# Patient Record
Sex: Male | Born: 1980 | Hispanic: No | Marital: Married | Smoking: Never smoker
Health system: Southern US, Community
[De-identification: ages and names within clinical notes are randomized; demographics above are authoritative.]

---

## 2014-04-12 ENCOUNTER — Encounter (HOSPITAL_BASED_OUTPATIENT_CLINIC_OR_DEPARTMENT_OTHER): Payer: Self-pay | Admitting: Emergency Medicine

## 2014-04-12 ENCOUNTER — Emergency Department (HOSPITAL_BASED_OUTPATIENT_CLINIC_OR_DEPARTMENT_OTHER): Payer: Worker's Compensation

## 2014-04-12 ENCOUNTER — Emergency Department (HOSPITAL_BASED_OUTPATIENT_CLINIC_OR_DEPARTMENT_OTHER)
Admission: EM | Admit: 2014-04-12 | Discharge: 2014-04-12 | Disposition: A | Discharge status: Home / Self Care | Payer: Worker's Compensation | Attending: Emergency Medicine | Admitting: Emergency Medicine

## 2014-04-12 DIAGNOSIS — S61209A Unspecified open wound of unspecified finger without damage to nail, initial encounter: Secondary | ICD-10-CM | POA: Insufficient documentation

## 2014-04-12 DIAGNOSIS — Y990 Civilian activity done for income or pay: Secondary | ICD-10-CM | POA: Diagnosis not present

## 2014-04-12 DIAGNOSIS — Y9389 Activity, other specified: Secondary | ICD-10-CM | POA: Insufficient documentation

## 2014-04-12 DIAGNOSIS — Y9289 Other specified places as the place of occurrence of the external cause: Secondary | ICD-10-CM | POA: Insufficient documentation

## 2014-04-12 DIAGNOSIS — W298XXA Contact with other powered powered hand tools and household machinery, initial encounter: Secondary | ICD-10-CM | POA: Insufficient documentation

## 2014-04-12 DIAGNOSIS — S61219A Laceration without foreign body of unspecified finger without damage to nail, initial encounter: Secondary | ICD-10-CM

## 2014-04-12 MED ORDER — BUPIVACAINE HCL (PF) 0.5 % IJ SOLN
INTRAMUSCULAR | Status: AC
  Filled 2014-04-12: qty 10

## 2014-04-12 MED ORDER — BUPIVACAINE HCL (PF) 0.5 % IJ SOLN: 10.0000 mL | Freq: Once | INTRAMUSCULAR | Status: DC

## 2014-04-12 MED ORDER — HYDROCODONE-ACETAMINOPHEN 5-325 MG PO TABS: 1.0000 | ORAL_TABLET | ORAL | Status: AC | PRN

## 2014-04-12 MED ORDER — BUPIVACAINE HCL 0.5 % IJ SOLN
INTRAMUSCULAR | Status: AC
  Administered 2014-04-12: 10 mL
  Filled 2014-04-12: qty 1

## 2014-04-12 NOTE — ED Provider Notes (Addendum)
CSN: [id number]     Arrival date & time 04/12/14  [id number] History   First MD Initiated Contact with Patient 04/12/14 [phone]     Chief Complaint  Patient presents with  . Extremity Laceration    left second finger      HPI  Patient presents after an injury at work. He was holding a piece of metal with his left hand. He is attempting to drill through his right hand holding the drill. The it became lodged in the metal on the metal spun. He has a laceration to his left index finger. He has normal sensation.  History reviewed. No pertinent past medical history. History reviewed. No pertinent past surgical history. No family history on file. History  Substance Use Topics  . Smoking status: Never Smoker   . Smokeless tobacco: Never Used  . Alcohol Use: No    Review of Systems  Skin:       Wound to the left index finger. No additional areas of injury.      Allergies  Review of patient's allergies indicates no known allergies.  Home Medications   Prior to Admission medications   Medication Sig Start Date End Date Taking? Authorizing Provider  HYDROcodone-acetaminophen (NORCO/VICODIN) 5-325 MG per tablet Take 1 tablet by mouth every 4 (four) hours as needed. 04/12/14   Rolland PorterMark James, MD   BP 115/68  Pulse 98  Temp(Src) 98.5 F (36.9 C) (Oral)  Resp 16  SpO2 100% Physical Exam  Musculoskeletal:       Hands: FDP intact with flexion to the DIP joint. Normal sensation to the flap.    ED Course  LACERATION REPAIR Date/Time: 04/12/2014 11:19 AM Performed by: Rolland PorterJAMES, MARK Authorized by: Rolland PorterJAMES, MARK Consent: Verbal consent obtained. written consent obtained. Risks and benefits: risks, benefits and alternatives were discussed Consent given by: patient Patient understanding: patient states understanding of the procedure being performed Patient identity confirmed: verbally with patient Body area: upper extremity Location details: left index finger Laceration length: 7 cm Tendon  involvement: Laceration extends to, but not through the FDP tendons. Nerve involvement: none Vascular damage: no Anesthesia: digital block Local anesthetic: bupivacaine 0.5% without epinephrine Anesthetic total: 5 ml Patient sedated: no Preparation: Patient was prepped and draped in the usual sterile fashion. Irrigation solution: saline Irrigation method: syringe Amount of cleaning: standard Debridement: minimal (Defatting of devitalized adipose tissue) Degree of undermining: none Skin closure: 4-0 nylon Number of sutures: 10 Technique: simple Approximation: close Approximation difficulty: simple Dressing: Finger splint, Vaseline gauze, tube gauze. Patient tolerance: Patient tolerated the procedure well with no immediate complications.   (including critical care time) Labs Review Labs Reviewed - No data to display  Imaging Review Dg Finger Index Left  04/12/2014   CLINICAL DATA:  Laceration  EXAM: LEFT INDEX FINGER 2+V  COMPARISON:  None.  FINDINGS: Laceration on the plantar surface of the finger. Negative for fracture or foreign body.  IMPRESSION: Negative.   Electronically Signed   By: Marlan Palauharles  Clark M.D.   On: 04/12/2014 10:56     EKG Interpretation None      MDM   Final diagnoses:  Finger laceration, initial encounter    This laceration crosses his flexor surface of his DIP joint. I will have him followup with hand surgery. He may need to go through physical therapy to avoid contracture of his wound.  Exploration an exam here does not show nerve, or tendon injury. The wound extends to his FDP tendons are shown the proximal volar  P3. However, as it is taken through the range of motion, and visualized during repair, it is intact.  I discussed with him that the flap does have blood supply. After the tourniquet was removed after repair he does have return of capillary refill but it is delayed to the proximal aspect of this flap I've asked him to recheck here in 48 hours to  assess flap viability. Asked him to call his surgeon on-call. He was given Dr. Waynetta Peanonnolly's information. I would like him to be seen by hand surgery because this does cross a volar joint. There would be concern for long-term contracture.    Rolland PorterMark James, MD 04/12/14 1013  Rolland PorterMark James, MD 04/12/14 [phone]

## 2014-04-12 NOTE — Discharge Instructions (Signed)
Please return here in 48 hours for a wound check.  Call Dr. Izora Ribasoley, hand surgeon, for an appointment in 7-10 days.  The laceration crosses the joint which can cause some problems long-term with wound contraction. This is why I would like you to see the hand surgeon.  The "flap" of the wound can lose its blood supply and turn black. This is why I want to recheck in 48 hours  Cuidados de una laceracin - Adultos  (Laceration Care, Adult)  Neomia DearUna laceracin es un corte que atraviesa todas las capas de la piel. El corte llega hasta las capas inferiores de la piel.  CUIDADOS EN EL HOGAR  Si tiene puntos (suturas) o grapas:   Mantenga el corte limpio y seco.  Si tiene un (vendaje) cmbielo al menos una vez al da. Cmbielo si se moja o se ensucia, o segn las indicaciones del mdico.  Lave el corte dos veces por da con agua y [place]jabn. Enjugelo con agua. Seque dando palmaditas con un pao limpio y seco.  Aplique una capa delgada de crema con medicamento sobre el corte, segn las indicaciones del mdico.  Puede ducharse despus de las primeras 24 horas. No remoje la herida en agua hasta que le hayan quitado los puntos.  Tome slo los medicamentos que le haya indicado el mdico.  Concurra para que le retiren los puntos cuando el mdico le indique. En caso que tenga tiras [id number]:   Mantenga la herida limpia y seca.  No deje que las tiras se mojen. Puede tomar un bao, pero tenga cuidado de no mojar el corte.  Si se moja, squelo dando palmaditas con una toalla limpia.  Las tiras caern por s mismas. No quite las tiras que estn pegadas a la herida. En caso que le hayan Morrowaplicado adhesivo.   Puede ducharse o tomar un bao de inmersin. No frote ni sumerja la herida. Nopractique natacin. Evite transpirar mucho hasta que el [place] Gardensadhesivo desaparezca. Despus de ducharse o darse un bao, seque el corte dando palmaditas con una toalla limpia.  No aplique medicamentos ni maquillaje hasta que el  Monticelloadhesivo caiga.  Si tiene un vendaje, No peque cinta adhesiva sobre el QUALCOMMadhesivo  Evite la luz solar o las lmparas para bronceado hasta que el Readeradhesivo desaparezca. Aplique pantalla solar sobre el corte durante Dispensing opticianel primer ao, para reducir la Training and development officercicatriz.  El [place] Hillsadhesivo caer por s mismo. No quite el pegamento. Deber aplicarse la vacuna contra el ttanos si:  No recuerda cundo se coloc la vacuna la ltima vez.  Nunca recibi esta vacuna. Si usted necesita aplicarse la vacuna y se niega a recibirla, corre riesgo de contraer ttanos. sta es una enfermedad grave.  SOLICITE AYUDA DE INMEDIATO SI:   El dolor no mejora con los medicamentos prescriptos.  Pierde la sensibilidad (adormecimiento) en el brazo, la mano, la pierna o el pie u observa cambios en el color.  El corte sangra.  Siente debilidad en la articulacin o no puede usarla.  Tiene bultos que le duelen en el cuerpo.  La zona del corte est roja, le duele o esthinchada.  Hay una lnea roja en la piel, cerca del corte.  Observa un lquido blanco amarillento (pus) en la herida.  Tiene fiebre.  Advierte un olor ftido que proviene de la herida o del vendaje.  La herida se abre antes o despus de que le Mattelhayan quitado los puntos.  Nota que en la herida hay algn cuerpo extrao como un trozo de [place]madera o vidrio.  No puede mover los dedos. ASEGRESE DE QUE:   Comprende estas instrucciones.  Controlar su enfermedad.  Solicitar ayuda de inmediato si no mejora o si empeora. Document Released: 06/04/2011 Document Revised: 12/29/2011 [facility] Patient Information 2015 [employer], [place]. This information is not intended to replace advice given to you by your health care provider. Make sure you discuss any questions you have with your health care provider.

## 2014-04-12 NOTE — ED Notes (Signed)
Patient states he was working with a drill, drilling a hole through a piece of metal, when the strap of the drill got caught in the drill and went into his left second finger.

## 2015-03-05 IMAGING — CR DG FINGER INDEX 2+V*L*
3 series · 3 of 3 positions shown · non-contrast
Comparison: None.

CLINICAL DATA: Laceration

EXAM:
LEFT INDEX FINGER 2+V

[x finger pa left]
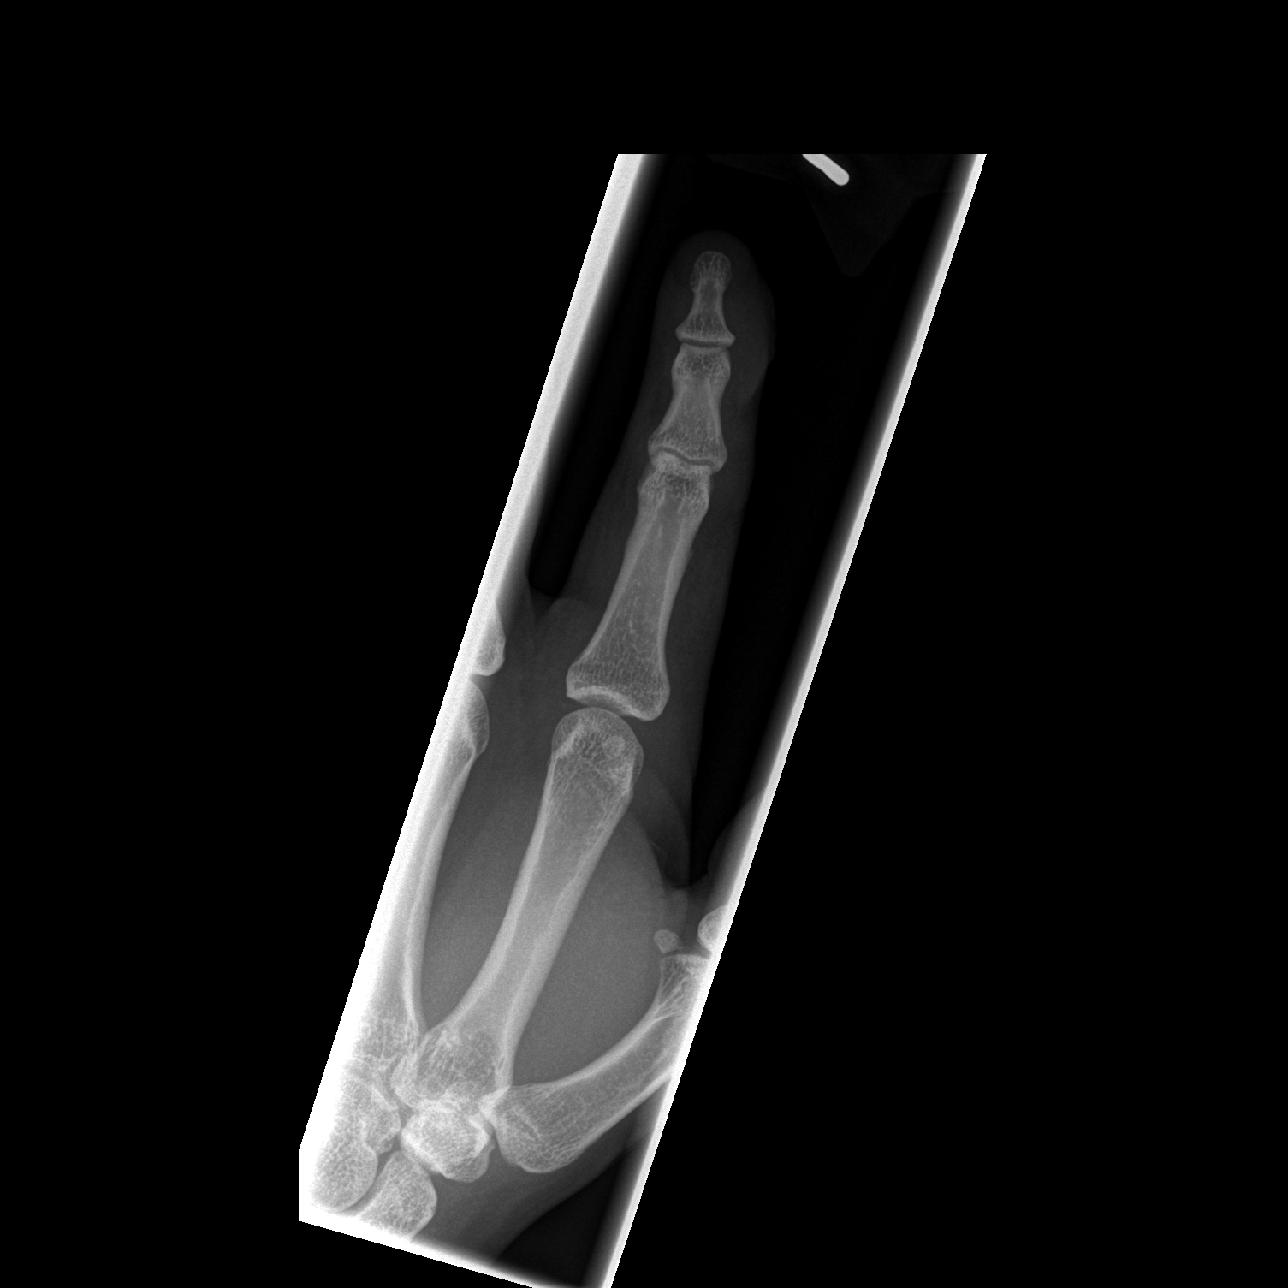

[x finger obl. left]
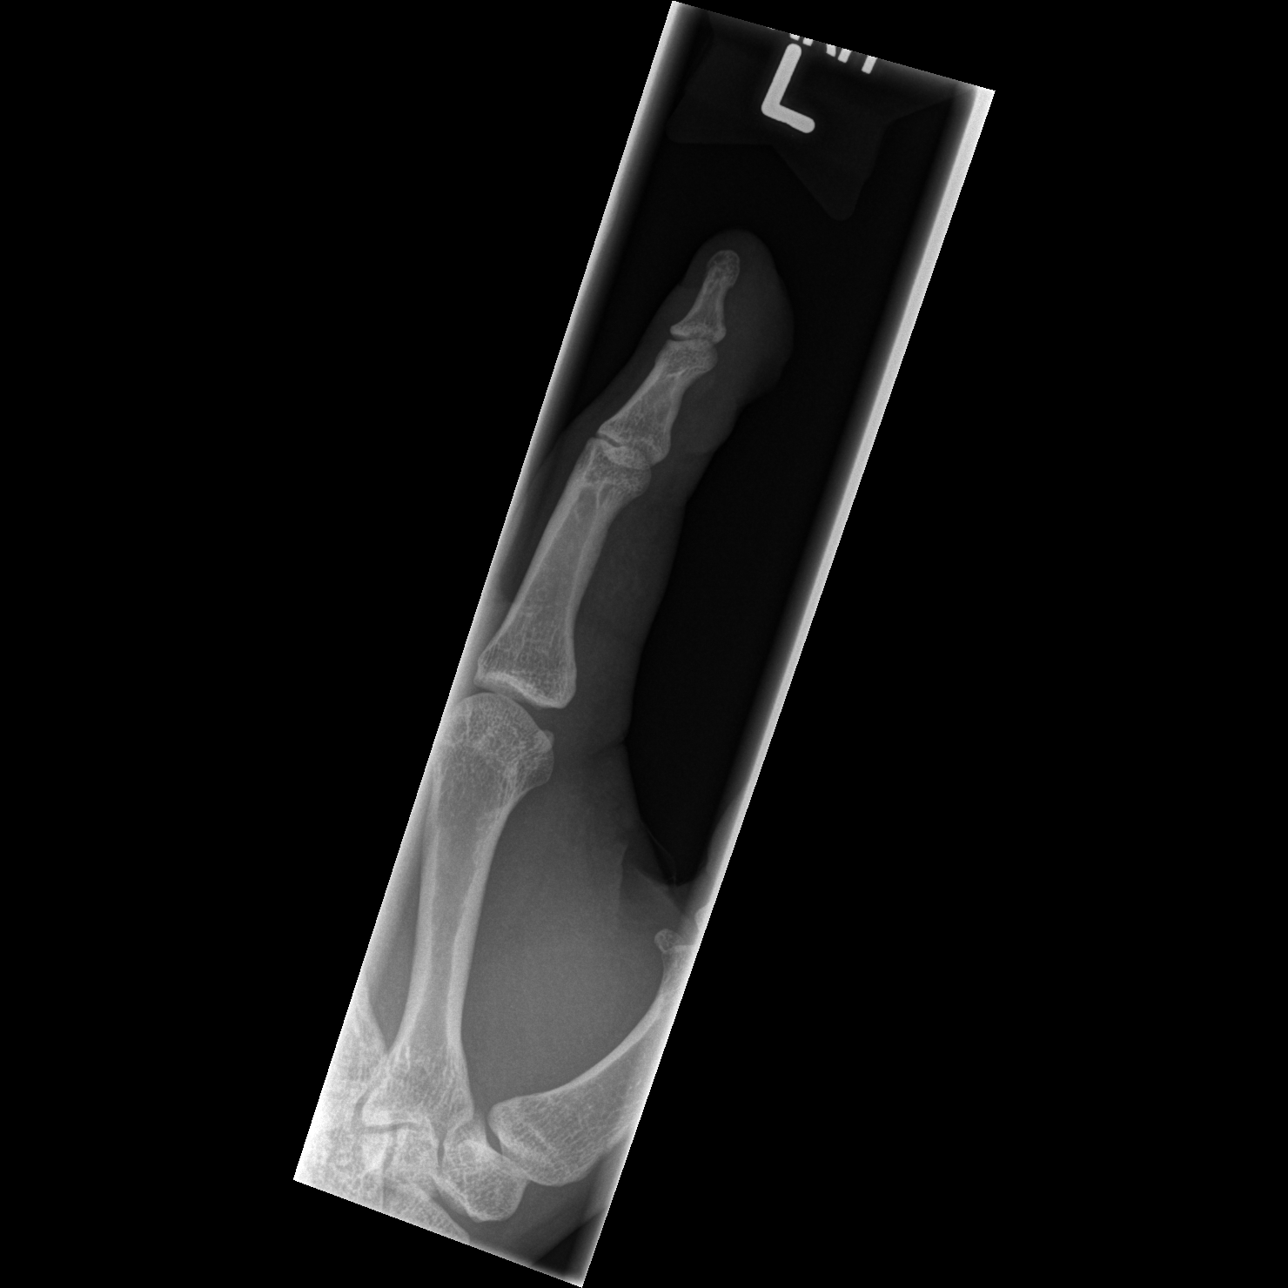

[x finger lateral left]
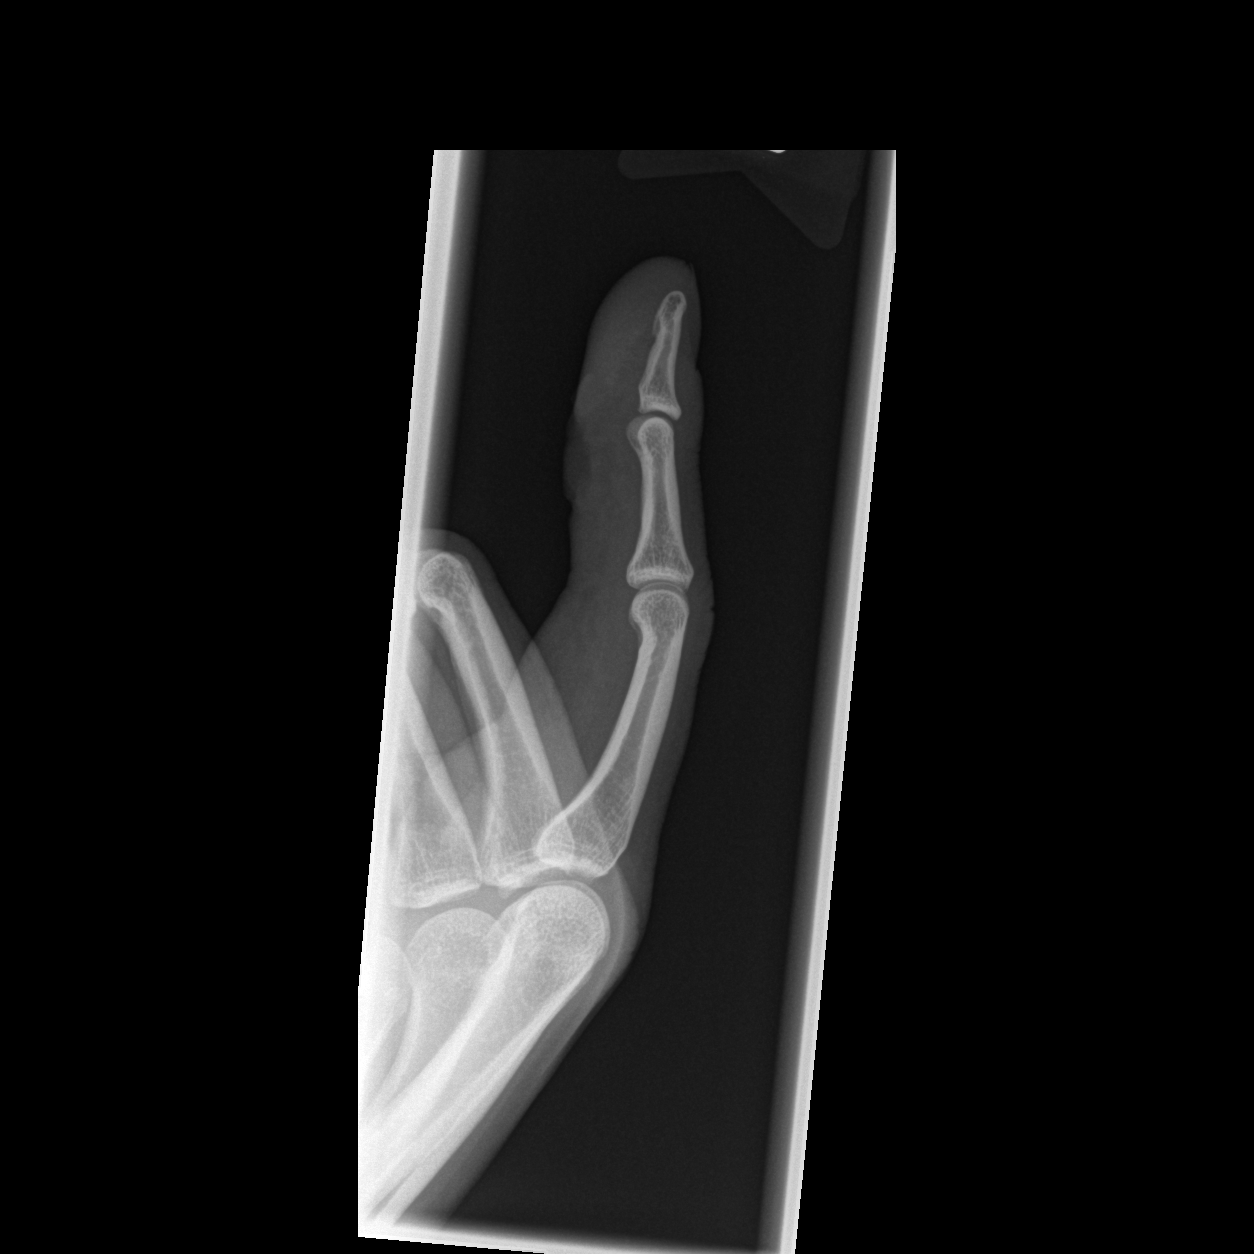

[3 of 3 positions shown; findings below may reference images not displayed]

FINDINGS: Laceration on the plantar surface of the finger. Negative for
fracture or foreign body.
IMPRESSION: Negative.
